# Patient Record
Sex: Female | Born: 1995 | Race: White | Hispanic: No | Marital: Single
Health system: Southern US, Community
[De-identification: ages and names within clinical notes are randomized; demographics above are authoritative.]

---

## 2017-07-28 ENCOUNTER — Emergency Department (HOSPITAL_COMMUNITY)
Admission: EM | Admit: 2017-07-28 | Discharge: 2017-07-29 | Disposition: A | Discharge status: Home / Self Care | Payer: Self-pay | Attending: Emergency Medicine | Admitting: Emergency Medicine

## 2017-07-28 ENCOUNTER — Emergency Department (HOSPITAL_COMMUNITY): Payer: Self-pay

## 2017-07-28 DIAGNOSIS — F10920 Alcohol use, unspecified with intoxication, uncomplicated: Secondary | ICD-10-CM

## 2017-07-28 DIAGNOSIS — Y906 Blood alcohol level of 120-199 mg/100 ml: Secondary | ICD-10-CM | POA: Insufficient documentation

## 2017-07-28 DIAGNOSIS — F10220 Alcohol dependence with intoxication, uncomplicated: Secondary | ICD-10-CM | POA: Insufficient documentation

## 2017-07-28 LAB — I-STAT CHEM 8, ED
BUN: 8 mg/dL (ref 6–20)
CHLORIDE: 108 mmol/L (ref 101–111)
CREATININE: 0.8 mg/dL (ref 0.44–1.00)
Calcium, Ion: 1.1 mmol/L — ABNORMAL LOW (ref 1.15–1.40)
Glucose, Bld: 112 mg/dL — ABNORMAL HIGH (ref 65–99)
HEMATOCRIT: 35 % — AB (ref 36.0–46.0)
Hemoglobin: 11.9 g/dL — ABNORMAL LOW (ref 12.0–15.0)
Potassium: 4 mmol/L (ref 3.5–5.1)
Sodium: 143 mmol/L (ref 135–145)
TCO2: 21 mmol/L — AB (ref 22–32)

## 2017-07-28 LAB — CBC WITH DIFFERENTIAL/PLATELET
BASOS ABS: 0 10*3/uL (ref 0.0–0.1)
BASOS PCT: 1 %
EOS PCT: 0 %
Eosinophils Absolute: 0 10*3/uL (ref 0.0–0.7)
HCT: 34.7 % — ABNORMAL LOW (ref 36.0–46.0)
Hemoglobin: 11.9 g/dL — ABNORMAL LOW (ref 12.0–15.0)
Lymphocytes Relative: 14 %
Lymphs Abs: 1.2 10*3/uL (ref 0.7–4.0)
MCH: 29.2 pg (ref 26.0–34.0)
MCHC: 34.3 g/dL (ref 30.0–36.0)
MCV: 85.3 fL (ref 78.0–100.0)
Monocytes Absolute: 0.2 10*3/uL (ref 0.1–1.0)
Monocytes Relative: 3 %
Neutro Abs: 7 10*3/uL (ref 1.7–7.7)
Neutrophils Relative %: 82 %
PLATELETS: 297 10*3/uL (ref 150–400)
RBC: 4.07 MIL/uL (ref 3.87–5.11)
RDW: 12.2 % (ref 11.5–15.5)
WBC: 8.5 10*3/uL (ref 4.0–10.5)

## 2017-07-28 LAB — RAPID URINE DRUG SCREEN, HOSP PERFORMED
Amphetamines: NOT DETECTED
Barbiturates: NOT DETECTED
Benzodiazepines: NOT DETECTED
COCAINE: NOT DETECTED
Opiates: NOT DETECTED
Tetrahydrocannabinol: NOT DETECTED

## 2017-07-28 LAB — URINALYSIS, ROUTINE W REFLEX MICROSCOPIC
BILIRUBIN URINE: NEGATIVE
GLUCOSE, UA: NEGATIVE mg/dL
Hgb urine dipstick: NEGATIVE
KETONES UR: NEGATIVE mg/dL
LEUKOCYTES UA: NEGATIVE
NITRITE: NEGATIVE
PROTEIN: 100 mg/dL — AB
Specific Gravity, Urine: 1.014 (ref 1.005–1.030)
pH: 5 (ref 5.0–8.0)

## 2017-07-28 LAB — SALICYLATE LEVEL: Salicylate Lvl: <7 mg/dL (ref 2.8–30.0)

## 2017-07-28 LAB — COMPREHENSIVE METABOLIC PANEL
ALK PHOS: 39 U/L (ref 38–126)
ALT: 17 U/L (ref 14–54)
AST: 20 U/L (ref 15–41)
Albumin: 4.2 g/dL (ref 3.5–5.0)
Anion gap: 8 (ref 5–15)
BILIRUBIN TOTAL: 0.5 mg/dL (ref 0.3–1.2)
BUN: 11 mg/dL (ref 6–20)
CALCIUM: 8.6 mg/dL — AB (ref 8.9–10.3)
CO2: 21 mmol/L — ABNORMAL LOW (ref 22–32)
CREATININE: 0.65 mg/dL (ref 0.44–1.00)
Chloride: 112 mmol/L — ABNORMAL HIGH (ref 101–111)
GFR calc Af Amer: >60 mL/min (ref >60)
Glucose, Bld: 113 mg/dL — ABNORMAL HIGH (ref 65–99)
POTASSIUM: 4 mmol/L (ref 3.5–5.1)
Sodium: 141 mmol/L (ref 135–145)
TOTAL PROTEIN: 7.5 g/dL (ref 6.5–8.1)

## 2017-07-28 LAB — I-STAT BETA HCG BLOOD, ED (MC, WL, AP ONLY): I-stat hCG, quantitative: <5 m[IU]/mL (ref <5)

## 2017-07-28 LAB — CBG MONITORING, ED: Glucose-Capillary: 105 mg/dL — ABNORMAL HIGH (ref 65–99)

## 2017-07-28 LAB — ETHANOL: Alcohol, Ethyl (B): 189 mg/dL — ABNORMAL HIGH (ref <10)

## 2017-07-28 LAB — I-STAT CG4 LACTIC ACID, ED: Lactic Acid, Venous: 2.66 mmol/L (ref 0.5–1.9)

## 2017-07-28 LAB — ACETAMINOPHEN LEVEL

## 2017-07-28 MED ORDER — SODIUM CHLORIDE 0.9 % IV BOLUS
1000.0000 mL | Freq: Once | INTRAVENOUS | Status: AC
  Administered 2017-07-28: 1000 mL via INTRAVENOUS

## 2017-07-28 NOTE — ED Notes (Signed)
Bed: RESB Expected date:  Expected time:  Means of arrival:  Comments: Room 5

## 2017-07-28 NOTE — ED Triage Notes (Addendum)
Pt BIB EMS from [employer] way--per EMS friends report that pt may have had about 3 shouts and 3 beers, pt suddenly went unresponsive/syncopal episode where pt was "wobbly" and friends laid pt down on ground. Hx of syncopal episodes with exercise according to mother, but not other medical hx.  zofran given en route via EMS. EMS states pt having an intermittent "twitch" only to left arm. Boyfriend at bedside

## 2017-07-28 NOTE — ED Notes (Signed)
Patient currently in CT °

## 2017-07-28 NOTE — ED Provider Notes (Signed)
[facility]-EMERGENCY DEPT Provider Note   CSN: [id number] Arrival date & time: 07/28/17  2100     History   Chief Complaint No chief complaint on file.   HPI Anne Yu is a 22 y.o. female.  HPI  The pt arrives by EMS with altered MS after she was found drinking heavily today - 3 beers and 3 shots - and then had to vomit.  She now down on the ground vomit, a police officer saw her and helped her to the ground to lay down.  Ever since that time she has been essentially unresponsive, not talking, vital signs were unremarkable according to paramedics.  The patient is unable to give any information.  She has her eyes closed, is clenching her jaw and breathing intermittently.  Boyfriend gives additional information that she is otherwise healthy, takes no daily medications, no history of seizures, has been able to "drink more than that without trouble in the past".  Level 5 caveat applies due to altered mental status.  No past medical history on file.  There are no active problems to display for this patient.    The histories are not reviewed yet. Please review them in the "History" navigator section and refresh this SmartLink.   OB History   None      Home Medications    Prior to Admission medications   Not on File    Family History No family history on file.  Social History Social History   Tobacco Use  . Smoking status: Not on file  Substance Use Topics  . Alcohol use: Not on file  . Drug use: Not on file     Allergies   Penicillins   Review of Systems Review of Systems  Unable to perform ROS: Mental status change     Physical Exam Updated Vital Signs BP 121/77 (BP Location: Right Arm)   Pulse 68   Resp 17   Ht  (1.676 m)   Wt 54.4 kg (120 lb)   SpO2 100%   BMI 19.37 kg/m   Physical Exam  Constitutional: She appears well-developed and well-nourished. She appears distressed.  HENT:  Head: Normocephalic and  atraumatic.  Mouth/Throat: No oropharyngeal exudate.  Unable to examine the patient's oropharynx that she is clenching her jaws.  No injuries to the lips  Eyes: Pupils are equal, round, and reactive to light. Conjunctivae and EOM are normal. Right eye exhibits no discharge. Left eye exhibits no discharge. No scleral icterus.  Pupils are both dilated but reactive  Neck: Normal range of motion. Neck supple. No JVD present. No thyromegaly present.  Cardiovascular: Regular rhythm, normal heart sounds and intact distal pulses. Exam reveals no gallop and no friction rub.  No murmur heard. Mild tachycardia, normal pulses  Pulmonary/Chest: Effort normal and breath sounds normal. No respiratory distress. She has no wheezes. She has no rales.  Lung sounds are clear but patient has decreased respiratory effort  Abdominal: Soft. Bowel sounds are normal. She exhibits no distension and no mass. There is no tenderness.  Soft nontender abdomen, no distention, no masses  Musculoskeletal: Normal range of motion. She exhibits no edema or tenderness.  No signs of deformity edema or palpable tenderness to the 4 extremities.  She has supple joints diffusely, soft compartments diffusely.  Lymphadenopathy:    She has no cervical adenopathy.  Neurological:  The patient is somnolent, difficult to arouse, she does not respond to painful stimuli very well.  She seems to  be breathing spontaneously, her pupils react as above.  The patient is having some myoclonic jerking movements of her all 4 extremities, this is not rhythmic or coordinated.  She is clenching her jaw.  Skin: Skin is warm. No rash noted. She is diaphoretic. No erythema.  Psychiatric: She has a normal mood and affect. Her behavior is normal.  Nursing note and vitals reviewed.    ED Treatments / Results  Labs (all labs ordered are listed, but only abnormal results are displayed) Labs Reviewed  COMPREHENSIVE METABOLIC PANEL - Abnormal; Notable for the  following components:      Result Value   Chloride 112 (*)    CO2 21 (*)    Glucose, Bld 113 (*)    Calcium 8.6 (*)    All other components within normal limits  CBC WITH DIFFERENTIAL/PLATELET - Abnormal; Notable for the following components:   Hemoglobin 11.9 (*)    HCT 34.7 (*)    All other components within normal limits  URINALYSIS, ROUTINE W REFLEX MICROSCOPIC - Abnormal; Notable for the following components:   Protein, ur 100 (*)    Bacteria, UA RARE (*)    All other components within normal limits  ETHANOL - Abnormal; Notable for the following components:   Alcohol, Ethyl (B) 189 (*)    All other components within normal limits  ACETAMINOPHEN LEVEL - Abnormal; Notable for the following components:   Acetaminophen (Tylenol), Serum <10 (*)    All other components within normal limits  CBG MONITORING, ED - Abnormal; Notable for the following components:   Glucose-Capillary 105 (*)    All other components within normal limits  I-STAT CG4 LACTIC ACID, ED - Abnormal; Notable for the following components:   Lactic Acid, Venous 2.66 (*)    All other components within normal limits  I-STAT CHEM 8, ED - Abnormal; Notable for the following components:   Glucose, Bld 112 (*)    Calcium, Ion 1.10 (*)    TCO2 21 (*)    Hemoglobin 11.9 (*)    HCT 35.0 (*)    All other components within normal limits  RAPID URINE DRUG SCREEN, HOSP PERFORMED  SALICYLATE LEVEL  I-STAT BETA HCG BLOOD, ED ([facility], [facility], [facility] ONLY)  CBG MONITORING, ED    EKG EKG Interpretation  Date/Time:  Saturday July 28 2017 22:02:28 EDT Ventricular Rate:  68 PR Interval:    QRS Duration: 92 QT Interval:  404 QTC Calculation: 430 R Axis:   78 Text Interpretation:  Sinus rhythm Normal ECG No old tracing to compare Confirmed by Eber Hong ([id number]) on 07/28/2017 11:02:57 PM   Radiology Ct Head Wo Contrast  Result Date: 07/28/2017 CLINICAL DATA:  Patient had a syncopal episode after 3 shots in 3 years. EXAM: CT  HEAD WITHOUT CONTRAST TECHNIQUE: Contiguous axial images were obtained from the base of the skull through the vertex without intravenous contrast. COMPARISON:  None. FINDINGS: Study degraded by motion artifacts despite repeat imaging. Brain: No acute intracranial hemorrhage, large vascular territory infarct, and hydrocephalus, intra-axial mass nor extra-axial fluid. Midline fourth ventricle and basal cisterns without effacement. Vascular: No hyperdense vessel sign. Skull: No acute skull fracture. Sinuses/Orbits: Intact orbits and globes.  Clear mastoids. Other: None IMPRESSION: Study slightly limited by patient motion. No acute intracranial abnormality or skull fracture is identified. Electronically Signed   By: Tollie Eth M.D.   On: 07/28/2017 22:04    Procedures Procedures (including critical care time)  Medications Ordered in ED Medications  sodium chloride  0.9 % bolus 1,000 mL (1,000 mLs Intravenous New Bag/Given 07/28/17 2344)     Initial Impression / Assessment and Plan / ED Course  I have reviewed the triage vital signs and the nursing notes.  Pertinent labs & imaging results that were available during my care of the patient were reviewed by me and considered in my medical decision making (see chart for details).    The patient is a very bizarre exam, she has no prior history of anything like this is far as we know.  At one point the patient had slowed her breathing down and refused to do anything including open her eyes are open her mouth.  When I threatened her with intubation and being on life support she very quickly open her mouth and took large breaths until her oxygen immediately went back to 100%.  This is strange, the patient is clearly upset, this could be anxiety, this could be something intracranial, this could be something related to her toxidrome.  At this time I think she will be able to avoid intubation however she will need to be on cardiac monitoring and close observation  until we figure out more reports going on.  CT scan, labs.  CT scan unremarkable, labs are starting to come back and show no acute findings other than a lactic acid which was slightly elevated although I do not think the patient is septic.  This is clearly related to her presentation and alcohol intoxication.  Alcohol 189  Acetaminophen undetectable  Metabolic panel normal  Drug screen negative  I discussed the care of the patient with her mother who is her essential next of kin, the patient is now coming around and is now able to talk answer questions and talk to her mother on the phone.  She will be observed for a short time and then discharged if she continues to improve.  Change of shift - care signed out to Dr. Nicanor Alcon  Final Clinical Impressions(s) / ED Diagnoses   Final diagnoses:  Alcoholic intoxication without complication [facility])    ED Discharge Orders    None       Eber Hong, MD 07/29/17 0003

## 2017-07-28 NOTE — ED Notes (Signed)
Bed: [facility] Expected date:  Expected time:  Means of arrival:  Comments: EMS intoxicated female

## 2017-07-29 NOTE — ED Notes (Signed)
Pt walked to bathroom by herself and back

## 2018-11-16 IMAGING — CT CT HEAD W/O CM
3 of 4 series · 14 of 47 positions shown, 16 images · non-contrast
Comparison: None.

CLINICAL DATA: Patient had a syncopal episode after 3 shots in 3
years.

EXAM:
CT HEAD WITHOUT CONTRAST
TECHNIQUE: Contiguous axial images were obtained from the base of the skull
through the vertex without intravenous contrast.

[Series 2: head wo · axial · 0.47mm/px · z∈[-1,+119]mm · 8 of 31 slices shown, 10 images]
[im 4/31  brain]
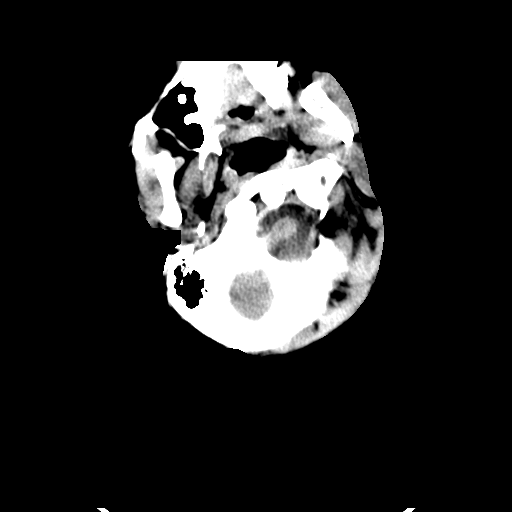
[im 4/31  bone]
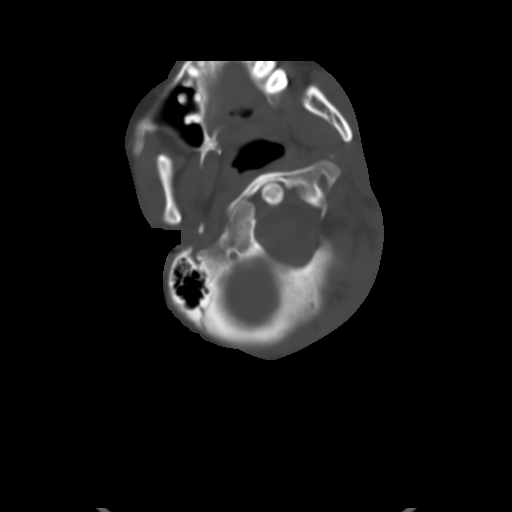
[im 7/31  brain]
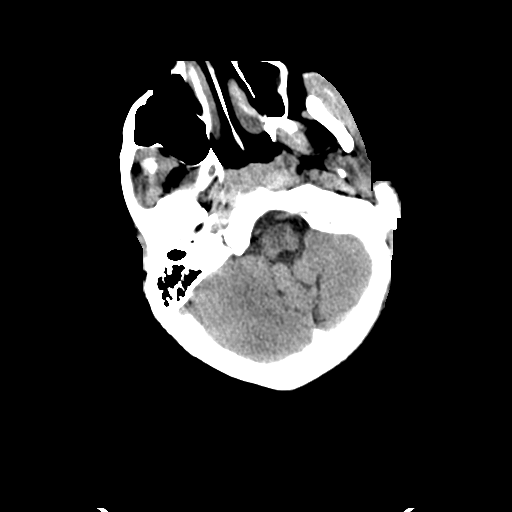
[im 10/31  brain]
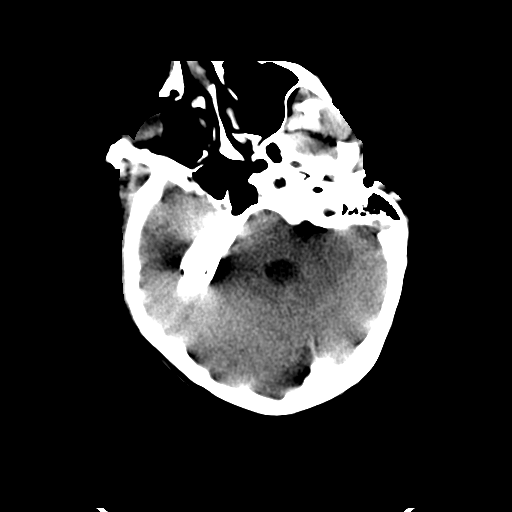
[im 14/31  brain]
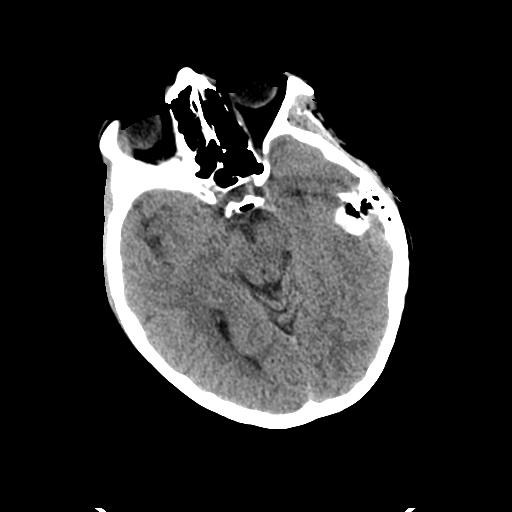
[im 17/31  brain]
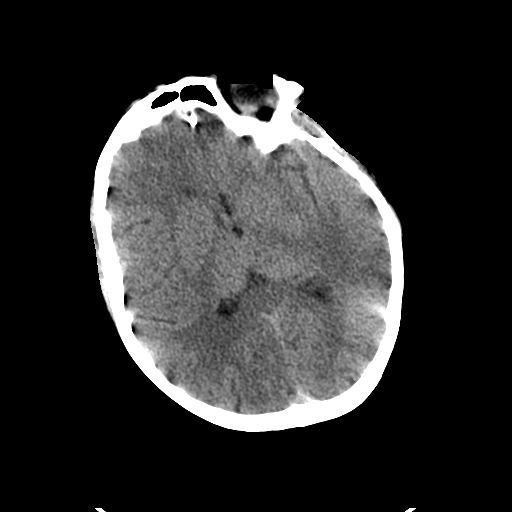
[im 17/31  bone]
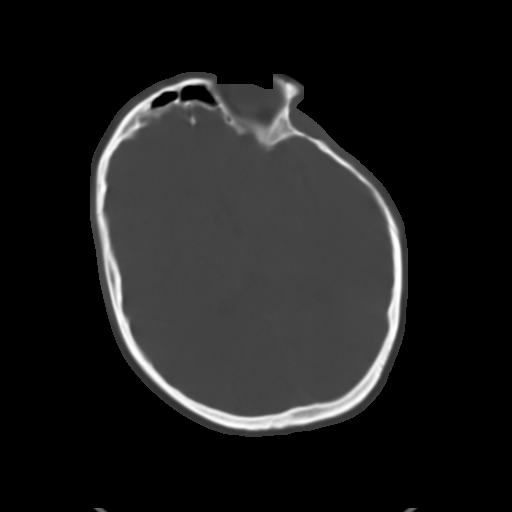
[im 22/31  brain]
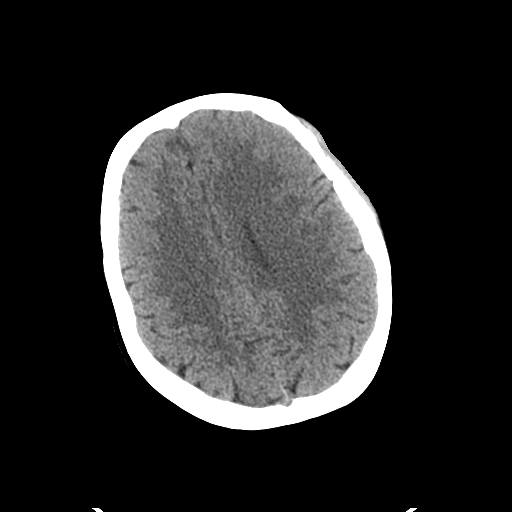
[im 25/31  brain]
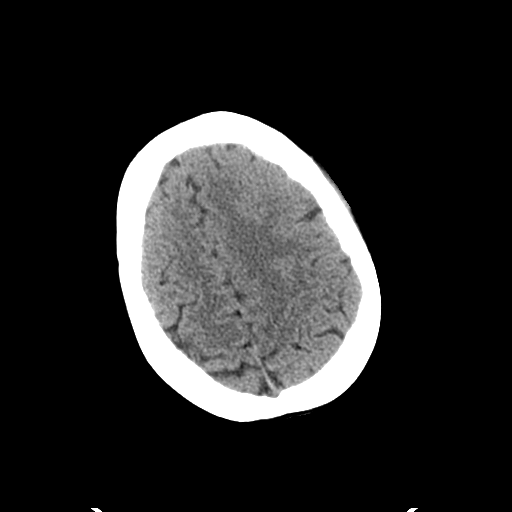
[im 28/31  brain]
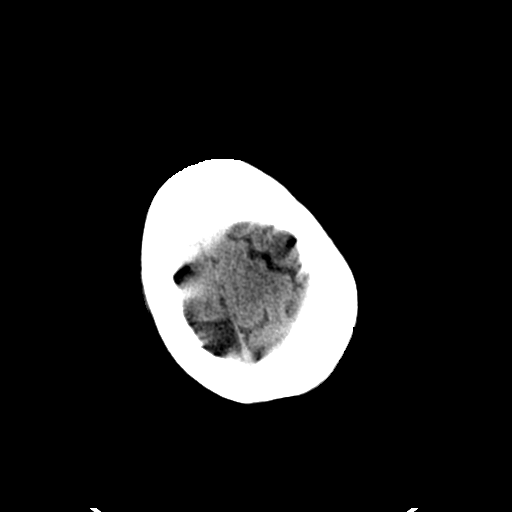

[Series 4: coronal soft tissue · coronal · 0.30mm/px · 3 of 84 slices shown]
[im 28/84  brain]
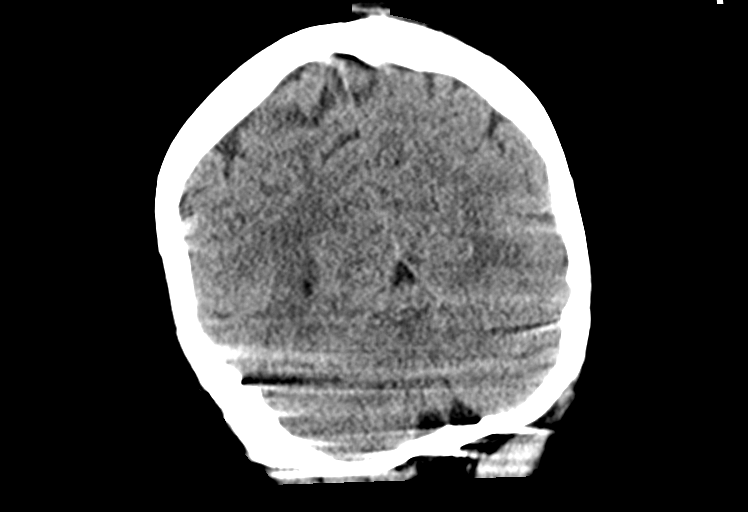
[im 37/84  brain]
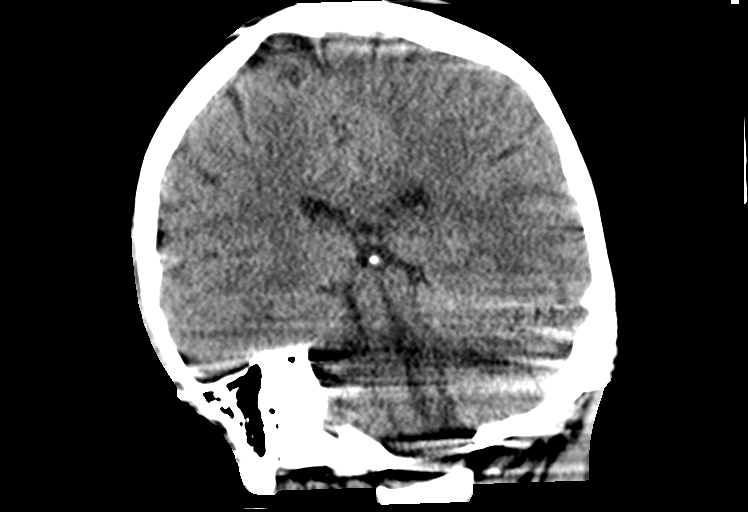
[im 47/84  brain]
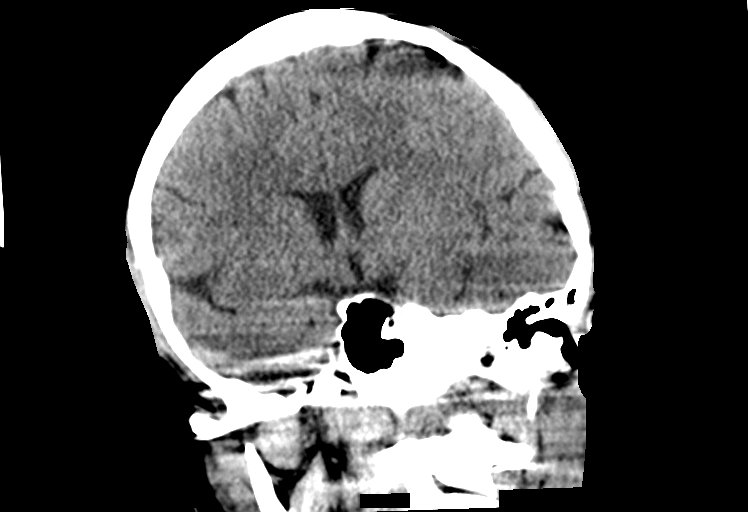

[Series 5: sagittal soft tissue · sagittal · 0.30mm/px · 3 of 70 slices shown]
[im 24/70  brain]
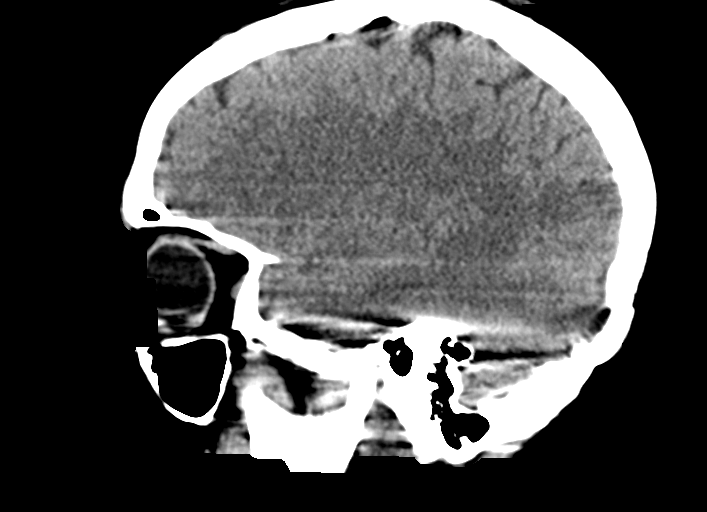
[im 35/70  brain]
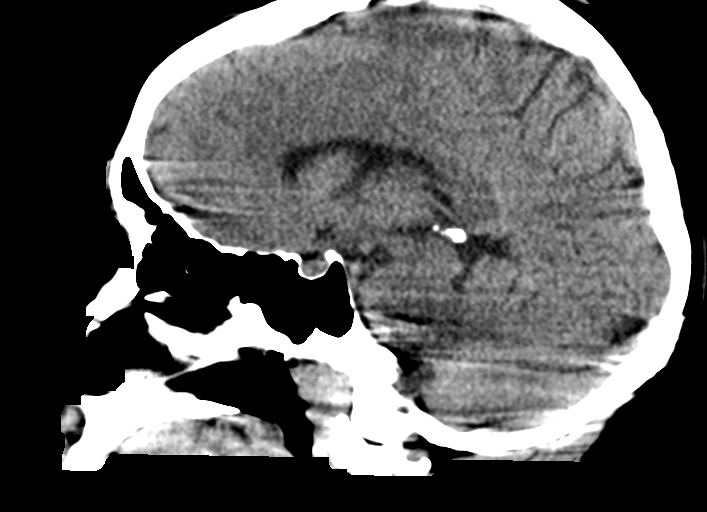
[im 47/70  brain]
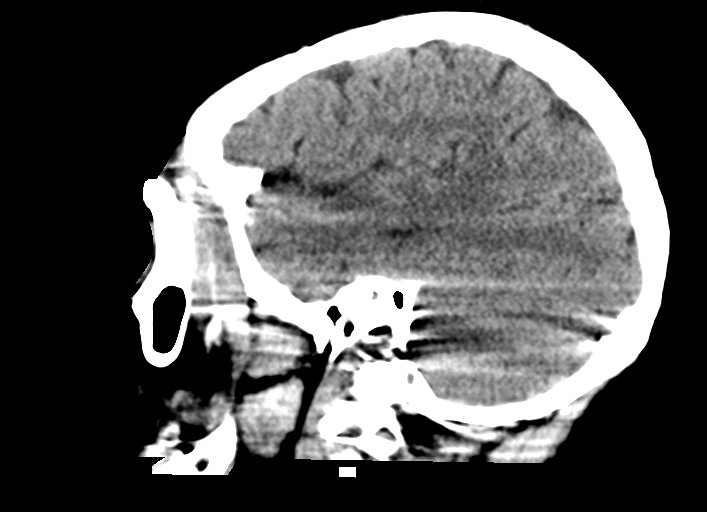

[14 of 47 positions shown; findings below may reference images not displayed]

FINDINGS: Study degraded by motion artifacts despite repeat imaging.

Brain: No acute intracranial hemorrhage, large vascular territory
infarct, and hydrocephalus, intra-axial mass nor extra-axial fluid.
Midline fourth ventricle and basal cisterns without effacement.

Vascular: No hyperdense vessel sign.

Skull: No acute skull fracture.

Sinuses/Orbits: Intact orbits and globes.  Clear mastoids.

Other: None
IMPRESSION: Study slightly limited by patient motion. No acute intracranial
abnormality or skull fracture is identified.
